# Patient Record
Sex: Male | Born: 1950 | Race: Black or African American | Hispanic: No | Marital: Married | Smoking: Never smoker
Health system: Southern US, Community
[De-identification: ages and names within clinical notes are randomized; demographics above are authoritative.]

## PROBLEM LIST (undated history)

## (undated) HISTORY — PX: COLOSTOMY: SHX63

---

## 2017-08-14 ENCOUNTER — Emergency Department (HOSPITAL_COMMUNITY)
Admission: EM | Admit: 2017-08-14 | Discharge: 2017-08-14 | Disposition: A | Discharge status: Home / Self Care | Payer: Medicare Other | Attending: Emergency Medicine | Admitting: Emergency Medicine

## 2017-08-14 ENCOUNTER — Encounter (HOSPITAL_COMMUNITY): Payer: Self-pay | Admitting: Emergency Medicine

## 2017-08-14 DIAGNOSIS — Z7189 Other specified counseling: Secondary | ICD-10-CM

## 2017-08-14 DIAGNOSIS — Z433 Encounter for attention to colostomy: Secondary | ICD-10-CM | POA: Insufficient documentation

## 2017-08-14 DIAGNOSIS — K922 Gastrointestinal hemorrhage, unspecified: Secondary | ICD-10-CM | POA: Diagnosis not present

## 2017-08-14 DIAGNOSIS — R195 Other fecal abnormalities: Secondary | ICD-10-CM | POA: Diagnosis not present

## 2017-08-14 DIAGNOSIS — Z4801 Encounter for change or removal of surgical wound dressing: Secondary | ICD-10-CM | POA: Insufficient documentation

## 2017-08-14 DIAGNOSIS — Z5189 Encounter for other specified aftercare: Secondary | ICD-10-CM

## 2017-08-14 LAB — COMPREHENSIVE METABOLIC PANEL
ALK PHOS: 63 U/L (ref 38–126)
ALT: 13 U/L — AB (ref 17–63)
ANION GAP: 5 (ref 5–15)
AST: 21 U/L (ref 15–41)
Albumin: 3 g/dL — ABNORMAL LOW (ref 3.5–5.0)
BILIRUBIN TOTAL: 0.3 mg/dL (ref 0.3–1.2)
BUN: 15 mg/dL (ref 6–20)
CALCIUM: 8.3 mg/dL — AB (ref 8.9–10.3)
CO2: 28 mmol/L (ref 22–32)
CREATININE: 0.78 mg/dL (ref 0.61–1.24)
Chloride: 107 mmol/L (ref 101–111)
GFR calc Af Amer: >60 mL/min (ref >60)
GFR calc non Af Amer: >60 mL/min (ref >60)
GLUCOSE: 96 mg/dL (ref 65–99)
Potassium: 3.8 mmol/L (ref 3.5–5.1)
SODIUM: 140 mmol/L (ref 135–145)
TOTAL PROTEIN: 7.1 g/dL (ref 6.5–8.1)

## 2017-08-14 LAB — TYPE AND SCREEN
ABO/RH(D): O POS
ANTIBODY SCREEN: NEGATIVE

## 2017-08-14 LAB — CBC
HCT: 31.1 % — ABNORMAL LOW (ref 39.0–52.0)
Hemoglobin: 11.3 g/dL — ABNORMAL LOW (ref 13.0–17.0)
MCH: 31.8 pg (ref 26.0–34.0)
MCHC: 36.3 g/dL — AB (ref 30.0–36.0)
MCV: 87.6 fL (ref 78.0–100.0)
PLATELETS: 268 10*3/uL (ref 150–400)
RBC: 3.55 MIL/uL — ABNORMAL LOW (ref 4.22–5.81)
RDW: 15.4 % (ref 11.5–15.5)
WBC: 7.6 10*3/uL (ref 4.0–10.5)

## 2017-08-14 LAB — ABO/RH: ABO/RH(D): O POS

## 2017-08-14 NOTE — ED Notes (Signed)
Pt stable, ambulatory, and verbalizes understanding of d/c instructions.  

## 2017-08-14 NOTE — ED Notes (Signed)
Occult card at bedside 

## 2017-08-14 NOTE — ED Triage Notes (Addendum)
Pt reports he had surgery on his rectum and a colostomy done 3-4 weeks ago, was supposed to have a follow up on 6/11 but could not go due to his mother being sick. reports that he is having pain and bleeding from his rectum that began a few days after surgery, reports bleeding is very mild, bright red in color, only a small amount when he wipes.

## 2017-08-14 NOTE — ED Provider Notes (Signed)
[facility] EM[facility]ENC[facility] Provider Note   CSN: [id number] Arrival date & time: 08/14/17  1009     History   Chief Complaint Chief Complaint  Patient presents with  . Post-op Problem    HPI Delena ServeCarl Monterrosa is a 67 y.o. male.  HPI  67 year old male comes in with chief complaint of bloody stools and ostomy problems. 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  Recently.  Patient has an ostomy bag in place.  He states that he was unable to get to his surgical follow-up on June 11, and states that he is out of his ostomy bags.  Patient also complains of persistent urge to defecate, with mucousy stools that are bloody.  No increase in stool output and no blood per ostomy bag.  Patient is not on any blood thinners and denies any dizziness, lightheadedness.  History reviewed. No pertinent past medical history.  There are no active problems to display for this patient.   Past Surgical History:  Procedure Laterality Date  . COLOSTOMY          Home Medications    Prior to Admission medications   Medication Sig Start Date End Date Taking? Authorizing Provider  acetaminophen (TYLENOL) 500 MG tablet Take 1,500 mg by mouth every 6 (six) hours as needed for mild pain or headache.   Yes [provider]  OVER THE COUNTER MEDICATION Apply 1 application topically daily as needed (Swelling of the Rectum).   Yes [provider]    Family History No family history on file.  Social History Social History   Tobacco Use  . Smoking status: Never Smoker  Substance Use Topics  . Alcohol use: Yes    Comment: occasional   . Drug use: Yes    Types: Marijuana     Allergies   Bee venom   Review of Systems Review of Systems  Constitutional: Positive for activity change.  Gastrointestinal: Positive for blood in stool.  Neurological: Negative for light-headedness.    Hematological: Does not bruise/bleed easily.     Physical Exam Updated Vital Signs BP (!) 154/77   Pulse 65   Temp 98.3 F (36.8 C) (Oral)   Resp 16   SpO2 100%   Physical Exam  Constitutional: He is oriented to person, place, and time. He appears well-developed.  HENT:  Head: Atraumatic.  Neck: Neck supple.  Cardiovascular: Normal rate.  Pulmonary/Chest: Effort normal.  Abdominal: Soft. There is no tenderness.  Patient has an ostomy wound which is covered by a Ziploc bag. No surrounding erythema or tenderness to palpation.  Neurological: He is alert and oriented to person, place, and time.  Skin: Skin is warm.  Nursing note and vitals reviewed.    ED Treatments / Results  Labs (all labs ordered are listed, but only abnormal results are displayed) Labs Reviewed  COMPREHENSIVE METABOLIC PANEL - Abnormal; Notable for the following components:      Result Value   Calcium 8.3 (*)    Albumin 3.0 (*)    ALT 13 (*)    All other components within normal limits  CBC - Abnormal; Notable for the following components:   RBC 3.55 (*)    Hemoglobin 11.3 (*)    HCT 31.1 (*)    MCHC 36.3 (*)    All other components within normal limits  TYPE AND SCREEN  ABO/RH    EKG None  Radiology No results found.  Procedures Procedures (including critical care time)  Medications Ordered in ED Medications - No data to display   Initial Impression / Assessment and Plan / ED Course  I have reviewed the triage vital signs and the nursing notes.  Pertinent labs & imaging results that were available during my care of the patient were reviewed by me and considered in my medical decision making (see chart for details).  Clinical Course as of Aug 14 1412  Sun Aug 14, 2017  1414 Results from the ER workup discussed with the patient face to face and all questions answered to the best of my ability.  Strict ER return precautions have been discussed, and patient is agreeing with the  plan and is comfortable with the workup done and the recommendations from the ER.    [AN]    Clinical Course User Index [AN] Derwood Kaplan, MD    67 year old male comes in with chief complaint of bloody stools and being out of ostomy supply. He has history of recent abdominal surgery that required partial colectomy.  Patient missed his June 11 appointment due to family emergency, and the surgery was done at the outside hospital.  I have called ostomy team to see if they can give patient the supplies. It seems like his bloody, mucousy stools are sequelae to his surgery.  Ostomy bag itself has no bloody drainage and there is no evidence of infection around the ostomy site.  Labs look reassuring. Patient informed that he will need to follow-up with his surgical service.  He will be discharged when the ostomy supplies been provided.  Final Clinical Impressions(s) / ED Diagnoses   Final diagnoses:  Encounter for ostomy care education  Visit for wound check  Acute lower GI bleeding    ED Discharge Orders    None       Derwood Kaplan, MD 08/14/17 1414

## 2017-08-14 NOTE — Discharge Instructions (Addendum)
You are seen in the ER for bloody stools and because you have run out of her ostomy bags.  We were able to give you supply of ostomy bag. There are no clinical concerns from our side with the mild bleeding or having, as in many instances it is expected. We [facility] for your postop follow-up and also to ensure that you are getting optimal ostomy care.

## 2017-09-11 ENCOUNTER — Encounter (HOSPITAL_COMMUNITY): Payer: Self-pay | Admitting: Emergency Medicine

## 2017-09-11 ENCOUNTER — Other Ambulatory Visit: Payer: Self-pay

## 2017-09-11 ENCOUNTER — Emergency Department (HOSPITAL_COMMUNITY)
Admission: EM | Admit: 2017-09-11 | Discharge: 2017-09-11 | Disposition: A | Discharge status: Home / Self Care | Payer: Medicare Other | Attending: Emergency Medicine | Admitting: Emergency Medicine

## 2017-09-11 DIAGNOSIS — Z933 Colostomy status: Secondary | ICD-10-CM | POA: Diagnosis not present

## 2017-09-11 DIAGNOSIS — Z760 Encounter for issue of repeat prescription: Secondary | ICD-10-CM | POA: Diagnosis present

## 2017-09-11 MED ORDER — LIDOCAINE HCL URETHRAL/MUCOSAL 2 % EX GEL: 1.0000 "application " | CUTANEOUS | 0 refills | Status: AC | PRN

## 2017-09-11 NOTE — ED Triage Notes (Signed)
Pt presents needing colostomy supplies; states he was sent the bags but needs the wafers that hold the bag; denies pain or other complications

## 2017-09-11 NOTE — ED Provider Notes (Signed)
[facility] Provider Note   CSN: [id number] Arrival date & time: 09/11/17  1746     History   Chief Complaint Chief Complaint  Patient presents with  . Colostomy supplies    HPI Brian Turner is a 67 y.o. male with a history of hidradenitis, enterocutaneous fistula, and partial colectomy who presents to the emergency department with a chief complaint of medication refill.  The patient has an ostomy bag in place.  He is scheduled to follow-up with his surgeon in 2 days, but has run out of his home colostomy supplies, specifically the wafers that hold the bag.   He also reports that he has noticed "a small ball" around the rectum and he is concerned that he is developing another abscess that may need to be drained.  He denies fever, chills, melena, hematochezia, increase in stool output, dizziness, or lightheadedness.  He reports that he is also out of his home pain medication and the lidocaine jelly that he was using for pain.   The history is provided by the patient. No language interpreter was used.    History reviewed. No pertinent past medical history.  There are no active problems to display for this patient.   Past Surgical History:  Procedure Laterality Date  . COLOSTOMY          Home Medications    Prior to Admission medications   Medication Sig Start Date End Date Taking? Authorizing Provider  acetaminophen (TYLENOL) 500 MG tablet Take 1,500 mg by mouth every 6 (six) hours as needed for mild pain or headache.    [provider]  lidocaine (XYLOCAINE) 2 % jelly 1 application by Other route as needed. Apply a thin layer to painful areas as needed. 09/11/17   Misael Mcgaha A, PA-C  OVER THE COUNTER MEDICATION Apply 1 application topically daily as needed (Swelling of the Rectum).    [provider]    Family History History reviewed. No pertinent family history.  Social History Social History   Tobacco Use    . Smoking status: Never Smoker  Substance Use Topics  . Alcohol use: Yes    Comment: occasional   . Drug use: Yes    Types: Marijuana     Allergies   Bee venom   Review of Systems Review of Systems  Constitutional: Negative for activity change, chills and fever.  Respiratory: Negative for shortness of breath.   Cardiovascular: Negative for chest pain.  Gastrointestinal: Negative for abdominal pain, anal bleeding and blood in stool.  Musculoskeletal: Negative for back pain.  Skin: Positive for wound. Negative for color change and rash.   Physical Exam Updated Vital Signs BP 133/76 (BP Location: Right Arm)   Pulse 63   Temp 98.7 F (37.1 C) (Oral)   Resp 16   Ht 5\' 9"  (1.753 m)   Wt 66.7 kg (147 lb)   SpO2 99%   BMI 21.71 kg/m   Physical Exam  Constitutional: He appears well-developed.  HENT:  Head: Normocephalic.  Eyes: Conjunctivae are normal.  Neck: Neck supple.  Cardiovascular: Normal rate and regular rhythm.  No murmur heard. Pulmonary/Chest: Effort normal.  Abdominal: Soft. He exhibits no distension and no mass. There is no tenderness. There is no rebound and no guarding. No hernia.  No tenderness to palpation to the ostomy site.  No surrounding erythema, edema, or warmth.  Genitourinary:  Genitourinary Comments: Chaperoned exam.  Well-healing surgical incision.  In the 3:00 area there appears  to be 0.5 cm area of fluctuance.  No active or drainage able to be expressed.  No surrounding erythema or warmth.  Neurological: He is alert.  Skin: Skin is warm and dry.  Psychiatric: His behavior is normal.  Nursing note and vitals reviewed.    ED Treatments / Results  Labs (all labs ordered are listed, but only abnormal results are displayed) Labs Reviewed - No data to display  EKG None  Radiology No results found.  Procedures Procedures (including critical care time)  Medications Ordered in ED Medications - No data to display   Initial  Impression / Assessment and Plan / ED Course  I have reviewed the triage vital signs and the nursing notes.  Pertinent labs & imaging results that were available during my care of the patient were reviewed by me and considered in my medical decision making (see chart for details).     67 year old male with a history of hidradenitis, enterocutaneous fistula, and partial colectomy who presents to the emergency department with a chief complaint of medication refill.  He is out of the way for as needed for his ostomy bag and does not have a follow-up appointment with his surgeon for another 2 days.  He also is concerned that may he may have another developing perianal abscess.  On exam, there is a 0.5 cm area of fluctuance in the 3:00 area.  Given the patient's extensive and complicated surgical history, will defer I&D at this time since the patient has a follow-up appointment in 2 days.  He has been given wafers in several ostomy pouches to last him for several days.  Strict return precautions given.  The patient is hemodynamically stable and in no acute distress.  He is safe for discharge home at this time with outpatient follow-up.  Final Clinical Impressions(s) / ED Diagnoses   Final diagnoses:  Encounter for medication refill    ED Discharge Orders        Ordered    lidocaine (XYLOCAINE) 2 % jelly  As needed     09/11/17 1924       Berthold Glace, Coral Else, PA-C 09/11/17 2133    Raeford Razor, MD 09/12/17 1531

## 2017-09-11 NOTE — Discharge Instructions (Signed)
Thank you for allowing me to care for you today in the Emergency Department.   Apply a thin layer of lidocaine jelly to areas that are sore as needed.  Keep your follow-up appointment with your surgeon on Tuesday to obtain your refills for your supplies.  Take 650 mg of Tylenol every 6 hours for pain control.   Return to the emergency department if you develop new or worsening symptoms including fever, chills, blood in your stool, or other new symptoms.

## 2017-09-11 NOTE — ED Notes (Signed)
Patient Alert and oriented to baseline. Stable and ambulatory to baseline. Patient verbalized understanding of the discharge instructions.  Patient belongings were taken by the patient.   

## 2017-11-13 ENCOUNTER — Emergency Department (HOSPITAL_COMMUNITY): Payer: Medicare Other

## 2017-11-13 ENCOUNTER — Other Ambulatory Visit: Payer: Self-pay

## 2017-11-13 ENCOUNTER — Emergency Department (HOSPITAL_COMMUNITY)
Admission: EM | Admit: 2017-11-13 | Discharge: 2017-11-14 | Disposition: A | Discharge status: Other Acute Inpatient Hospital | Payer: Medicare Other | Attending: Emergency Medicine | Admitting: Emergency Medicine

## 2017-11-13 DIAGNOSIS — Z933 Colostomy status: Secondary | ICD-10-CM | POA: Diagnosis not present

## 2017-11-13 DIAGNOSIS — K6289 Other specified diseases of anus and rectum: Secondary | ICD-10-CM | POA: Diagnosis present

## 2017-11-13 DIAGNOSIS — R509 Fever, unspecified: Secondary | ICD-10-CM | POA: Insufficient documentation

## 2017-11-13 DIAGNOSIS — K611 Rectal abscess: Secondary | ICD-10-CM | POA: Insufficient documentation

## 2017-11-13 LAB — CBC
HCT: 33 % — ABNORMAL LOW (ref 39.0–52.0)
HEMOGLOBIN: 11.9 g/dL — AB (ref 13.0–17.0)
MCH: 31.6 pg (ref 26.0–34.0)
MCHC: 36.1 g/dL — ABNORMAL HIGH (ref 30.0–36.0)
MCV: 87.5 fL (ref 78.0–100.0)
PLATELETS: 208 10*3/uL (ref 150–400)
RBC: 3.77 MIL/uL — AB (ref 4.22–5.81)
RDW: 15.2 % (ref 11.5–15.5)
WBC: 14.7 10*3/uL — ABNORMAL HIGH (ref 4.0–10.5)

## 2017-11-13 LAB — COMPREHENSIVE METABOLIC PANEL
ALBUMIN: 3 g/dL — AB (ref 3.5–5.0)
ALT: 12 U/L (ref 0–44)
AST: 22 U/L (ref 15–41)
Alkaline Phosphatase: 66 U/L (ref 38–126)
Anion gap: 10 (ref 5–15)
BUN: 11 mg/dL (ref 8–23)
CHLORIDE: 106 mmol/L (ref 98–111)
CO2: 23 mmol/L (ref 22–32)
CREATININE: 0.84 mg/dL (ref 0.61–1.24)
Calcium: 8.6 mg/dL — ABNORMAL LOW (ref 8.9–10.3)
GFR calc non Af Amer: >60 mL/min (ref >60)
GLUCOSE: 99 mg/dL (ref 70–99)
Potassium: 3.7 mmol/L (ref 3.5–5.1)
SODIUM: 139 mmol/L (ref 135–145)
Total Bilirubin: 0.4 mg/dL (ref 0.3–1.2)
Total Protein: 7.4 g/dL (ref 6.5–8.1)

## 2017-11-13 LAB — URINALYSIS, ROUTINE W REFLEX MICROSCOPIC
BACTERIA UA: NONE SEEN
Bilirubin Urine: NEGATIVE
Glucose, UA: NEGATIVE mg/dL
Hgb urine dipstick: NEGATIVE
Ketones, ur: 5 mg/dL — AB
Nitrite: NEGATIVE
Protein, ur: NEGATIVE mg/dL
SPECIFIC GRAVITY, URINE: 1.03 (ref 1.005–1.030)
pH: 5 (ref 5.0–8.0)

## 2017-11-13 MED ORDER — PIPERACILLIN-TAZOBACTAM 3.375 G IVPB 30 MIN
3.3750 g | Freq: Once | INTRAVENOUS | Status: AC
  Administered 2017-11-13: 3.375 g via INTRAVENOUS
  Filled 2017-11-13: qty 50

## 2017-11-13 MED ORDER — MORPHINE SULFATE (PF) 4 MG/ML IV SOLN
4.0000 mg | Freq: Once | INTRAVENOUS | Status: AC
  Administered 2017-11-13: 4 mg via INTRAVENOUS
  Filled 2017-11-13: qty 1

## 2017-11-13 MED ORDER — IOHEXOL 300 MG/ML  SOLN
100.0000 mL | Freq: Once | INTRAMUSCULAR | Status: AC | PRN
  Administered 2017-11-13: 100 mL via INTRAVENOUS

## 2017-11-13 NOTE — ED Provider Notes (Signed)
Emergency Department Provider Note   I have reviewed the triage vital signs and the nursing notes.   HISTORY  Chief Complaint Rectal Pain   HPI Brian Turner is a 67 y.o. 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 Elenore Rota at [facility] presents to the emergency department with rectal pain, fever yesterday, and some rectal drainage.  The patient states he is scheduled to follow with his GI surgeon later this month for additional procedures. He has an ostomy bag which is making good output. No fever today but reports a fever yesterday. Denies any anterior abdominal pain or vomiting. No radiation or symptoms or modifying factors. Patient called his surgeon on call at [facility] and states he is waiting on a call back.    No past medical history on file.  There are no active problems to display for this patient.   Past Surgical History:  Procedure Laterality Date  . COLOSTOMY      Allergies Bee venom  No family history on file.  Social History Social History   Tobacco Use  . Smoking status: Never Smoker  Substance Use Topics  . Alcohol use: Yes    Comment: occasional   . Drug use: Yes    Types: Marijuana    Review of Systems  Constitutional: No fever/chills Eyes: No visual changes. ENT: No sore throat. Cardiovascular: Denies chest pain. Respiratory: Denies shortness of breath. Gastrointestinal: No abdominal pain.  No nausea, no vomiting.  No diarrhea.  No constipation. Positive rectal pain.  Genitourinary: Negative for dysuria. Musculoskeletal: Negative for back pain. Skin: Negative for rash. Neurological: Negative for headaches, focal weakness or numbness.  10-point ROS otherwise negative.  ____________________________________________   PHYSICAL EXAM:  VITAL SIGNS: ED Triage Vitals  Enc Vitals Group     BP 11/13/17 1943 (!) 117/20     Pulse Rate 11/13/17 1943 70     Resp 11/13/17 1943 18     Temp  11/13/17 1943 99.3 F (37.4 C)     Temp src --      SpO2 11/13/17 1943 100 %     Weight 11/13/17 1947 147 lb (66.7 kg)     Height 11/13/17 1947 5\' 9"  (1.753 m)     Pain Score 11/13/17 1946 10   Constitutional: Alert and oriented. Well appearing and in no acute distress. Eyes: Conjunctivae are normal. Head: Atraumatic. Nose: No congestion/rhinnorhea. Mouth/Throat: Mucous membranes are moist.  Neck: No stridor.  Cardiovascular: Normal rate, regular rhythm. Good peripheral circulation. Grossly normal heart sounds.   Respiratory: Normal respiratory effort.  No retractions. Lungs CTAB. Gastrointestinal: Soft and nontender. No distention. Ostomy bag with good output. Seton's in place. No fluctuance or obvious abscess. No bleeding.  Musculoskeletal: No lower extremity tenderness nor edema. No gross deformities of extremities. Neurologic:  Normal speech and language. No gross focal neurologic deficits are appreciated.  Skin:  Skin is warm, dry and intact. No rash noted.  ____________________________________________   LABS (all labs ordered are listed, but only abnormal results are displayed)  Labs Reviewed  COMPREHENSIVE METABOLIC PANEL - Abnormal; Notable for the following components:      Result Value   Calcium 8.6 (*)    Albumin 3.0 (*)    All other components within normal limits  CBC - Abnormal; Notable for the following components:   WBC 14.7 (*)    RBC 3.77 (*)    Hemoglobin 11.9 (*)    HCT 33.0 (*)  MCHC 36.1 (*)    All other components within normal limits  URINALYSIS, ROUTINE W REFLEX MICROSCOPIC - Abnormal; Notable for the following components:   Color, Urine AMBER (*)    Ketones, ur 5 (*)    Leukocytes, UA SMALL (*)    All other components within normal limits   ____________________________________________  RADIOLOGY  Ct Abdomen Pelvis W Contrast  Result Date: 11/13/2017 CLINICAL DATA:  Rectal pain fever trace bleeding recent hemorrhoid procedure EXAM: CT  ABDOMEN AND PELVIS WITH CONTRAST TECHNIQUE: Multidetector CT imaging of the abdomen and pelvis was performed using the standard protocol following bolus administration of intravenous contrast. CONTRAST:  100mL OMNIPAQUE IOHEXOL 300 MG/ML  SOLN COMPARISON:  None. FINDINGS: Lower chest: Lung bases demonstrate no acute consolidation or effusion. The heart size is within normal limits. Hepatobiliary: No focal liver abnormality is seen. No gallstones, gallbladder wall thickening, or biliary dilatation. Pancreas: Unremarkable. No pancreatic ductal dilatation or surrounding inflammatory changes. Spleen: Normal in size without focal abnormality. Adrenals/Urinary Tract: Adrenal glands are unremarkable. Kidneys are normal, without renal calculi, focal lesion, or hydronephrosis. Bladder is unremarkable. Stomach/Bowel: The stomach is nonenlarged. No dilated small bowel. Left lower quadrant ostomy. Moderate perirectal soft tissue inflammatory change. Diffuse soft tissue thickening of the rectum and perianal soft tissues. Irregular mildly rim enhancing fluid collection within the left perirectal soft tissues measuring 3.9 x 1 cm concerning for abscess. Soft tissue thickening extending from the left posterior perianal region to the skin surface suspicious for fistula with small amount of rim enhancing fluid. Circular device within the right perianal soft tissues with surrounding soft tissue thickening. This appears to protrude external to the patient. Vascular/Lymphatic: Moderate-to-marked aortic atherosclerosis. No aneurysmal dilatation. Left external iliac stent with flow present. No significant adenopathy. Reproductive: Prostate calcification. Slightly indistinct plane between the posterior prostate and the anterior rectum. Other: Presacral and perirectal soft tissue edema. Prominent enhancing perirectal vessels without gross extravasation. Musculoskeletal: Degenerative changes. No acute or suspicious abnormality. IMPRESSION:  1. Marked rectal wall thickening with diffuse soft tissue enlargement of the perianal soft tissues consistent with proctitis. Irregular left perirectal fluid collection measuring 3.9 x 1 cm concerning for perirectal abscess. Circular surgical device within the right perianal soft tissues, partially external to the patient with surrounding soft tissue inflammatory changes. 2. Soft tissue thickening and small focal fluid collection extending from the left perianal region towards the medial gluteal surface, suspected to represent a fistula, possibly with small amount of infected fluid within. 3. Left lower quadrant ostomy. No evidence for bowel obstruction or acute intra-abdominal abnormality. Electronically Signed   By: Jasmine PangKim  Fujinaga M.D.   On: 11/13/2017 23:33    ____________________________________________   PROCEDURES  Procedure(s) performed:   Procedures  CRITICAL CARE Performed by: Maia PlanJoshua G Kristian Mogg Total critical care time: 35 minutes Critical care time was exclusive of separately billable procedures and treating other patients. Critical care was necessary to treat or prevent imminent or life-threatening deterioration. Critical care was time spent personally by me on the following activities: development of treatment plan with patient and/or surrogate as well as nursing, discussions with consultants, evaluation of patient's response to treatment, examination of patient, obtaining history from patient or surrogate, ordering and performing treatments and interventions, ordering and review of laboratory studies, ordering and review of radiographic studies, pulse oximetry and re-evaluation of patient's condition.  Alona BeneJoshua Baylen Buckner, MD Emergency Medicine  ____________________________________________   INITIAL IMPRESSION / ASSESSMENT AND PLAN / ED COURSE  Pertinent labs & imaging results that were available  during my care of the patient were reviewed by me and considered in my medical decision making  (see chart for details).  Patient presents to the emergency department with worsening rectal pain.  He has hidradenitis of the anus and is followed by Dr. Elenore Rota at [facility]. No obvious abscess on my exam but complicated by hydradenitis mass. Patient does have a leukocytosis on labs from triage. Plan for CT abdomen/pelvis to evaluate for deeper abscess not visualized on exam. If present, will discuss with surgery team at [facility].   11:56 PM Discussed the CT findings with radiology.  Had called Dr. Epifania Gore with GI surgery at [facility] to discuss the case. He accepts the patient in transfer to [facility]. Awaiting bed placement. EMTALA completed.  ____________________________________________  FINAL CLINICAL IMPRESSION(S) / ED DIAGNOSES  Final diagnoses:  Perirectal abscess    MEDICATIONS GIVEN DURING THIS VISIT:  Medications  piperacillin-tazobactam (ZOSYN) IVPB 3.375 g (3.375 g Intravenous New Bag/Given 11/13/17 2355)  morphine 4 MG/ML injection 4 mg (4 mg Intravenous Given 11/13/17 2203)  iohexol (OMNIPAQUE) 300 MG/ML solution 100 mL (100 mLs Intravenous Contrast Given 11/13/17 2242)    Note:  This document was prepared using Dragon voice recognition software and may include unintentional dictation errors.  Alona Bene, MD Emergency Medicine    [place] Zou, Arlyss Repress, MD 11/13/17 [phone]

## 2017-11-13 NOTE — ED Triage Notes (Addendum)
Patient c/o rectal pain (states [facility] ago to lance his "sweat glands") and states that his urine now looks "dirty" and "real dark". Also c/o testicle pain.

## 2017-11-13 NOTE — ED Notes (Signed)
[facility], [place] called to ask for a disc to send with pt

## 2017-11-14 DIAGNOSIS — R509 Fever, unspecified: Secondary | ICD-10-CM | POA: Diagnosis not present

## 2017-11-14 DIAGNOSIS — Z933 Colostomy status: Secondary | ICD-10-CM | POA: Diagnosis not present

## 2017-11-14 DIAGNOSIS — K6289 Other specified diseases of anus and rectum: Secondary | ICD-10-CM | POA: Diagnosis present

## 2017-11-14 DIAGNOSIS — K611 Rectal abscess: Secondary | ICD-10-CM | POA: Diagnosis not present

## 2017-11-14 NOTE — ED Notes (Signed)
[facility] called [facility]

## 2017-11-18 ENCOUNTER — Other Ambulatory Visit: Payer: Self-pay

## 2017-11-18 ENCOUNTER — Encounter (HOSPITAL_COMMUNITY): Payer: Self-pay | Admitting: Emergency Medicine

## 2017-11-18 ENCOUNTER — Emergency Department (HOSPITAL_COMMUNITY)
Admission: EM | Admit: 2017-11-18 | Discharge: 2017-11-19 | Disposition: A | Discharge status: Home / Self Care | Payer: Medicare Other | Attending: Emergency Medicine | Admitting: Emergency Medicine

## 2017-11-18 DIAGNOSIS — M79602 Pain in left arm: Secondary | ICD-10-CM | POA: Diagnosis present

## 2017-11-18 DIAGNOSIS — M7701 Medial epicondylitis, right elbow: Secondary | ICD-10-CM | POA: Insufficient documentation

## 2017-11-18 DIAGNOSIS — M7702 Medial epicondylitis, left elbow: Secondary | ICD-10-CM

## 2017-11-18 NOTE — ED Triage Notes (Signed)
Pt to ED with c/o bil arm swelling.  Pt st's he was [facility] after surg.  Pt st's now he has bil arm swelling and was told to come to ED

## 2017-11-19 MED ORDER — AMOXICILLIN-POT CLAVULANATE 875-125 MG PO TABS: 1.0000 | ORAL_TABLET | Freq: Two times a day (BID) | ORAL | 0 refills | Status: AC

## 2017-11-19 NOTE — ED Provider Notes (Signed)
[facility] EMERGENCY [facility]MENT Provider Note   CSN: [id number] Arrival date & time: 11/18/17  1720     History   Chief Complaint Chief Complaint  Patient presents with  . Arm Swelling    HPI Brian Turner is a 67 y.o. male.  Patient presents to the emergency department with a chief complaint of bilateral arm pain.  He states that he noticed the pain a couple of days ago.  States that he has been taking Cipro for recent abdominal infection.  He denies any fevers or chills.  He denies any other associated symptoms.  Denies any injuries.  The history is provided by the patient. No language interpreter was used.    History reviewed. No pertinent past medical history.  There are no active problems to display for this patient.   Past Surgical History:  Procedure Laterality Date  . COLOSTOMY          Home Medications    Prior to Admission medications   Medication Sig Start Date End Date Taking? Authorizing Provider  acetaminophen (TYLENOL) 500 MG tablet Take 1,500 mg by mouth every 6 (six) hours as needed for mild pain or headache.   Yes [provider]  Oxycodone HCl 10 MG TABS Take 10 mg by mouth every 4 (four) hours as needed (for pain).   Yes [provider]  amoxicillin-clavulanate (AUGMENTIN) 875-125 MG tablet Take 1 tablet by mouth every 12 (twelve) hours. 11/19/17   Roxy HorsemanBrowning, Kaydie Petsch, PA-C  lidocaine (XYLOCAINE) 2 % jelly 1 application by Other route as needed. Apply a thin layer to painful areas as needed. Patient not taking: Reported on 11/19/2017 09/11/17   Barkley BoardsMcDonald, Mia A, PA-C    Family History No family history on file.  Social History Social History   Tobacco Use  . Smoking status: Never Smoker  . Smokeless tobacco: Never Used  Substance Use Topics  . Alcohol use: Yes    Comment: occasional   . Drug use: Yes    Types: Marijuana     Allergies   Bee venom   Review of Systems Review of Systems  All other systems  reviewed and are negative.    Physical Exam Updated Vital Signs BP 132/69   Pulse 67   Temp 98.7 F (37.1 C) (Oral)   Resp 16   Ht 5\' 9"  (1.753 m)   Wt 65.3 kg   SpO2 99%   BMI 21.27 kg/m   Physical Exam  Constitutional: He is oriented to person, place, and time. He appears well-developed and well-nourished.  HENT:  Head: Normocephalic and atraumatic.  Eyes: Pupils are equal, round, and reactive to light. Conjunctivae and EOM are normal. Right eye exhibits no discharge. Left eye exhibits no discharge. No scleral icterus.  Neck: Normal range of motion. Neck supple. No JVD present.  Cardiovascular: Normal rate, regular rhythm and normal heart sounds. Exam reveals no gallop and no friction rub.  No murmur heard. Pulmonary/Chest: Effort normal and breath sounds normal. No respiratory distress. He has no wheezes. He has no rales. He exhibits no tenderness.  Abdominal: Soft. He exhibits no distension and no mass. There is no tenderness. There is no rebound and no guarding.  Musculoskeletal: Normal range of motion. He exhibits no edema or tenderness.  Bilateral tenderness over his medial epicondyles, no erythema or evidence of abscess  Neurological: He is alert and oriented to person, place, and time.  Skin: Skin is warm and dry.  Psychiatric: He has a normal  mood and affect. His behavior is normal. Judgment and thought content normal.  Nursing note and vitals reviewed.    ED Treatments / Results  Labs (all labs ordered are listed, but only abnormal results are displayed) Labs Reviewed - No data to display  EKG None  Radiology No results found.  Procedures Procedures (including critical care time)  Medications Ordered in ED Medications - No data to display   Initial Impression / Assessment and Plan / ED Course  I have reviewed the triage vital signs and the nursing notes.  Pertinent labs & imaging results that were available during my care of the patient were  reviewed by me and considered in my medical decision making (see chart for details).    Patient with symptoms concerning for bilateral medial epicondylitis.  There is some concern that this is related to his Cipro use, as this started after taking the Cipro.  We will discontinue this and start Augmentin.  I have also discontinued his Flagyl.  Recommend ice and heat.  Recommend caution with elbow use.  PCP follow-up.  Patient seen by and discussed with Dr. Blinda Leatherwood, who agrees with the plan.  Final Clinical Impressions(s) / ED Diagnoses   Final diagnoses:  Medial epicondylitis of left elbow  Epicondylitis elbow, medial, right    ED Discharge Orders         Ordered    amoxicillin-clavulanate (AUGMENTIN) 875-125 MG tablet  Every 12 hours     11/19/17 0634           Roxy Horseman, PA-C 11/19/17 4098    Gilda Crease, MD 11/19/17 [phone]

## 2017-11-19 NOTE — Discharge Instructions (Signed)
Your symptoms are thought to have been caused by the antibiotic ciprofloxacin.  Please stop taking the antibiotics prescribed (Cipro and metronidazole), we have switched your antibiotic to Augmentin.  Please take this instead.  Please follow-up with your regular doctor.  You may apply ice to your elbows.

## 2018-01-25 ENCOUNTER — Encounter (HOSPITAL_COMMUNITY): Payer: Self-pay

## 2018-01-25 ENCOUNTER — Emergency Department (HOSPITAL_COMMUNITY)
Admission: EM | Admit: 2018-01-25 | Discharge: 2018-01-26 | Payer: Medicare Other | Attending: Emergency Medicine | Admitting: Emergency Medicine

## 2018-01-25 DIAGNOSIS — Z933 Colostomy status: Secondary | ICD-10-CM | POA: Insufficient documentation

## 2018-01-25 DIAGNOSIS — Z79899 Other long term (current) drug therapy: Secondary | ICD-10-CM | POA: Diagnosis not present

## 2018-01-25 DIAGNOSIS — L732 Hidradenitis suppurativa: Secondary | ICD-10-CM | POA: Insufficient documentation

## 2018-01-25 DIAGNOSIS — K610 Anal abscess: Secondary | ICD-10-CM | POA: Diagnosis not present

## 2018-01-25 DIAGNOSIS — K625 Hemorrhage of anus and rectum: Secondary | ICD-10-CM | POA: Diagnosis present

## 2018-01-25 LAB — COMPREHENSIVE METABOLIC PANEL
ALT: 14 U/L (ref 0–44)
AST: 45 U/L — ABNORMAL HIGH (ref 15–41)
Albumin: 3.2 g/dL — ABNORMAL LOW (ref 3.5–5.0)
Alkaline Phosphatase: 67 U/L (ref 38–126)
Anion gap: 9 (ref 5–15)
BUN: 11 mg/dL (ref 8–23)
CHLORIDE: 99 mmol/L (ref 98–111)
CO2: 28 mmol/L (ref 22–32)
CREATININE: 0.86 mg/dL (ref 0.61–1.24)
Calcium: 8.8 mg/dL — ABNORMAL LOW (ref 8.9–10.3)
GFR calc non Af Amer: >60 mL/min (ref >60)
GLUCOSE: 90 mg/dL (ref 70–99)
Potassium: 3.8 mmol/L (ref 3.5–5.1)
Sodium: 136 mmol/L (ref 135–145)
Total Bilirubin: 0.7 mg/dL (ref 0.3–1.2)
Total Protein: 8.6 g/dL — ABNORMAL HIGH (ref 6.5–8.1)

## 2018-01-25 LAB — CBC
HCT: 33.3 % — ABNORMAL LOW (ref 39.0–52.0)
Hemoglobin: 11.9 g/dL — ABNORMAL LOW (ref 13.0–17.0)
MCH: 30.4 pg (ref 26.0–34.0)
MCHC: 35.7 g/dL (ref 30.0–36.0)
MCV: 84.9 fL (ref 80.0–100.0)
NRBC: 0 % (ref 0.0–0.2)
PLATELETS: 332 10*3/uL (ref 150–400)
RBC: 3.92 MIL/uL — AB (ref 4.22–5.81)
RDW: 14.4 % (ref 11.5–15.5)
WBC: 13.5 10*3/uL — ABNORMAL HIGH (ref 4.0–10.5)

## 2018-01-25 LAB — TYPE AND SCREEN
ABO/RH(D): O POS
Antibody Screen: NEGATIVE

## 2018-01-25 NOTE — ED Triage Notes (Signed)
Pt states for the past two days that he has been having rectal bleeding, [facility]. Pt also states that he needs colostomy supplies that he is out of wafers.

## 2018-01-26 ENCOUNTER — Emergency Department (HOSPITAL_COMMUNITY): Payer: Medicare Other

## 2018-01-26 DIAGNOSIS — K625 Hemorrhage of anus and rectum: Secondary | ICD-10-CM | POA: Diagnosis present

## 2018-01-26 DIAGNOSIS — K610 Anal abscess: Secondary | ICD-10-CM | POA: Diagnosis not present

## 2018-01-26 DIAGNOSIS — Z933 Colostomy status: Secondary | ICD-10-CM | POA: Diagnosis not present

## 2018-01-26 DIAGNOSIS — Z79899 Other long term (current) drug therapy: Secondary | ICD-10-CM | POA: Diagnosis not present

## 2018-01-26 DIAGNOSIS — L732 Hidradenitis suppurativa: Secondary | ICD-10-CM | POA: Diagnosis not present

## 2018-01-26 LAB — I-STAT CG4 LACTIC ACID, ED
Lactic Acid, Venous: 0.65 mmol/L (ref 0.5–1.9)
Lactic Acid, Venous: 0.81 mmol/L (ref 0.5–1.9)

## 2018-01-26 MED ORDER — IOHEXOL 300 MG/ML  SOLN
100.0000 mL | Freq: Once | INTRAMUSCULAR | Status: AC | PRN
  Administered 2018-01-26: 100 mL via INTRAVENOUS

## 2018-01-26 NOTE — ED Notes (Signed)
The pt reports that he gets his medical care at [facility]

## 2018-01-26 NOTE — ED Notes (Signed)
Paged [facility] general surgery

## 2018-01-26 NOTE — ED Notes (Signed)
To ct

## 2018-01-26 NOTE — ED Notes (Signed)
The pt has a colostomy he reports that he does nt have the supplies he needs for the colostomy bag

## 2018-01-26 NOTE — ED Provider Notes (Signed)
[facility] EMERGENCY DEPARTMENT Provider Note   CSN: [id number] Arrival date & time: 01/25/18  2155     History   Chief Complaint Chief Complaint  Patient presents with  . Rectal Bleeding    HPI Brian Turner is a 67 y.o. male.  Patient presents to the emergency department with a chief complaint of rectal discharge.  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  He receives his care at [facility].  He reports having had the discharge for the past 2 days.  He denies any fevers or chills.  He reports increased pain.  Denies having taken anything for symptoms.  The history is provided by the patient. No language interpreter was used.    History reviewed. No pertinent past medical history.  There are no active problems to display for this patient.   Past Surgical History:  Procedure Laterality Date  . COLOSTOMY          Home Medications    Prior to Admission medications   Medication Sig Start Date End Date Taking? Authorizing Provider  acetaminophen (TYLENOL) 500 MG tablet Take 1,500 mg by mouth every 6 (six) hours as needed for mild pain or headache.    [provider]  amoxicillin-clavulanate (AUGMENTIN) 875-125 MG tablet Take 1 tablet by mouth every 12 (twelve) hours. 11/19/17   Roxy Horseman, PA-C  lidocaine (XYLOCAINE) 2 % jelly 1 application by Other route as needed. Apply a thin layer to painful areas as needed. Patient not taking: Reported on 11/19/2017 09/11/17   McDonald, Pedro Earls A, PA-C  Oxycodone HCl 10 MG TABS Take 10 mg by mouth every 4 (four) hours as needed (for pain).    [provider]    Family History No family history on file.  Social History Social History   Tobacco Use  . Smoking status: Never Smoker  . Smokeless tobacco: Never Used  Substance Use Topics  . Alcohol use: Yes    Comment: occasional   . Drug use: Yes    Types: Marijuana     Allergies   Bee venom   Review of  Systems Review of Systems  All other systems reviewed and are negative.    Physical Exam Updated Vital Signs BP 131/70 (BP Location: Right Arm)   Pulse 76   Temp 99.3 F (37.4 C) (Oral)   Resp 18   Ht 5\' 9"  (1.753 m)   Wt 66.7 kg   SpO2 100%   BMI 21.71 kg/m   Physical Exam  Constitutional: He is oriented to person, place, and time. He appears well-developed and well-nourished.  HENT:  Head: Normocephalic and atraumatic.  Eyes: Pupils are equal, round, and reactive to light. Conjunctivae and EOM are normal. Right eye exhibits no discharge. Left eye exhibits no discharge. No scleral icterus.  Neck: Normal range of motion. Neck supple. No JVD present.  Cardiovascular: Normal rate, regular rhythm and normal heart sounds. Exam reveals no gallop and no friction rub.  No murmur heard. Pulmonary/Chest: Effort normal and breath sounds normal. No respiratory distress. He has no wheezes. He has no rales. He exhibits no tenderness.  Abdominal: Soft. He exhibits no distension and no mass. There is no tenderness. There is no rebound and no guarding.  Ostomy in lower central abdomen, functional, but with contact dermatitis around the base due to lack of dressing changes  Genitourinary:  Genitourinary Comments: Very large external hemorrhoid with purulent discharge concerning for redevelopment of perirectal abscess  Musculoskeletal: Normal range of motion. He exhibits no edema or tenderness.  Neurological: He is alert and oriented to person, place, and time.  Skin: Skin is warm and dry.  Psychiatric: He has a normal mood and affect. His behavior is normal. Judgment and thought content normal.  Nursing note and vitals reviewed.    ED Treatments / Results  Labs (all labs ordered are listed, but only abnormal results are displayed) Labs Reviewed  COMPREHENSIVE METABOLIC PANEL - Abnormal; Notable for the following components:      Result Value   Calcium 8.8 (*)    Total Protein 8.6 (*)     Albumin 3.2 (*)    AST 45 (*)    All other components within normal limits  CBC - Abnormal; Notable for the following components:   WBC 13.5 (*)    RBC 3.92 (*)    Hemoglobin 11.9 (*)    HCT 33.3 (*)    All other components within normal limits  I-STAT CG4 LACTIC ACID, ED  TYPE AND SCREEN    EKG None  Radiology Ct Abdomen Pelvis W Contrast  Result Date: 01/26/2018 CLINICAL DATA:  Rectal bleeding for several days.  Perianal abscess. EXAM: CT ABDOMEN AND PELVIS WITH CONTRAST TECHNIQUE: Multidetector CT imaging of the abdomen and pelvis was performed using the standard protocol following bolus administration of intravenous contrast. CONTRAST:  100mL OMNIPAQUE IOHEXOL 300 MG/ML  SOLN COMPARISON:  11/13/2017 FINDINGS: Lower Chest: No acute findings. Hepatobiliary: No hepatic masses identified. Gallbladder is unremarkable. Pancreas:  No mass or inflammatory changes. Spleen: Within normal limits in size and appearance. Adrenals/Urinary Tract: No masses identified. No evidence of hydronephrosis. Stomach/Bowel: Left lower quadrant colostomy is again seen. Diffuse rectal wall thickening and enhancement is unchanged since previous study and consistent with proctitis. A more well-defined horseshoe peripherally enhancing fluid collection is seen surrounding the posterior aspect of the anal canal, consistent with perianal abscess. This measures approximately 3.9 x 2.4 cm compared to 3.7 x 1.6 cm on prior study. A seton is seen along the posterior and inferior aspect of this collection, which extends into the gluteal crease. Vascular/Lymphatic: No pathologically enlarged lymph nodes. No abdominal aortic aneurysm. Aortic atherosclerosis. Reproductive:  No mass or other significant abnormality. Other:  None. Musculoskeletal:  No suspicious bone lesions identified. 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 Electronically Signed   By: Myles RosenthalJohn  Stahl M.D.    On: 01/26/2018 02:31    Procedures Procedures (including critical care time)  Medications Ordered in ED Medications - No data to display   Initial Impression / Assessment and Plan / ED Course  I have reviewed the triage vital signs and the nursing notes.  Pertinent labs & imaging results that were available during my care of the patient were reviewed by me and considered in my medical decision making (see chart for details).     Patient with a large perianal abscess.  Reports worsening discharge and drainage today.  [facility].  States that normally when this happens [facility]  CT shows increased size of a horseshoe perianal abscess when compared to prior CT done back in September.  Patient remains afebrile, but does have a leukocytosis to 13.5.  He does not have an elevated lactic acid level.  He states that it is unusual for her to drain this much.  Discussed this case [facility] Dr. Elwin Mochahaumont, who recommends ED to ED transfer  for general surgery evaluation and possible admission.  I discussed with the patient that it is possible that he may not be admitted.  He lives at home with his 11 year old mother, and unfortunately transportation upon discharge will be problematic.  Case discussed with Dr. Norlene Campbell, from [facility] emergency, who will accept the patient in transfer.  Patient seen by and discussed with Dr. Rhunette Croft.  Final Clinical Impressions(s) / ED Diagnoses   Final diagnoses:  Perianal abscess    ED Discharge Orders    None       Roxy Horseman, PA-C 01/26/18 0423    Derwood Kaplan, MD 01/26/18 [phone]

## 2018-01-26 NOTE — ED Notes (Signed)
Pt stands for [facility]. Some light-headed feelings, but steady on feet with minimal assistance x1 person.

## 2018-12-31 DEATH — deceased

## 2020-01-08 IMAGING — CT CT ABD-PELV W/ CM
2 of 5 series · 16 of 46 positions shown, 18 images · IV contrast (Omni 300)
Comparison: 11/13/2017

CLINICAL DATA: Rectal bleeding for several days.  Perianal abscess.

EXAM:
CT ABDOMEN AND PELVIS WITH CONTRAST
TECHNIQUE: Multidetector CT imaging of the abdomen and pelvis was performed
using the standard protocol following bolus administration of
intravenous contrast.
CONTRAST:  100mL OMNIPAQUE IOHEXOL 300 MG/ML  SOLN

[Series 3: a/p w/ 5mm · axial · 0.78mm/px · z∈[+1033,+1418]mm · 13 of 87 slices shown, 15 images]
[im 5/87  soft-tissue]
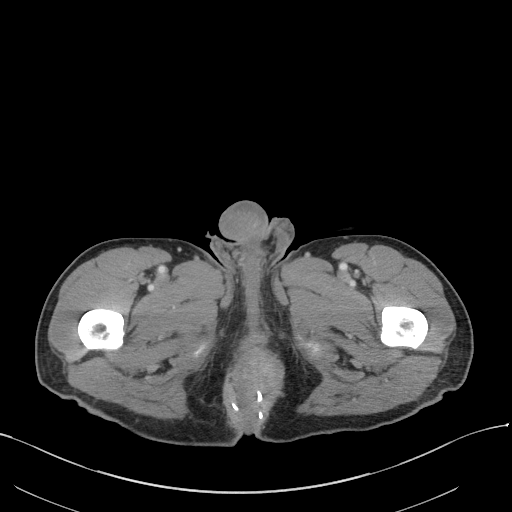
[im 5/87  bone]
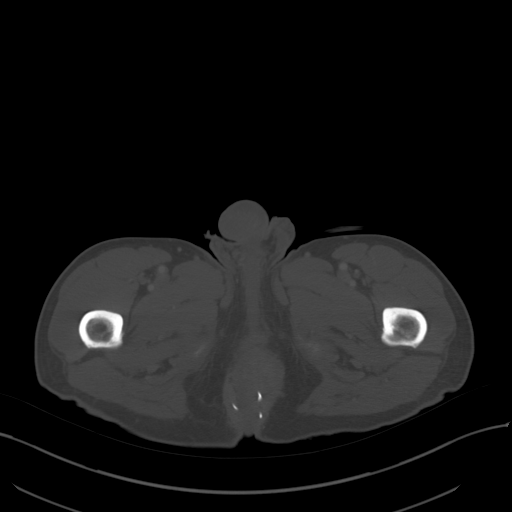
[im 14/87  soft-tissue]
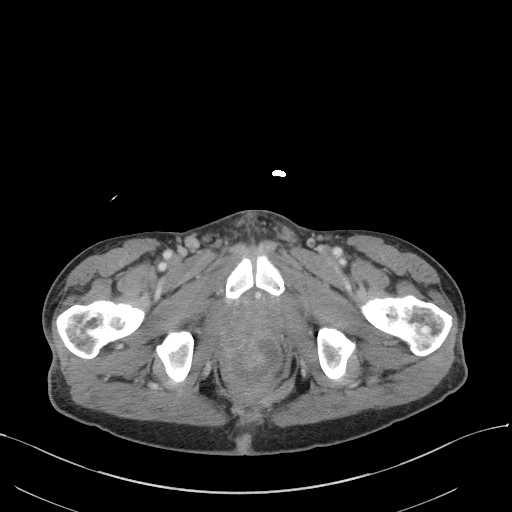
[im 19/87  soft-tissue]
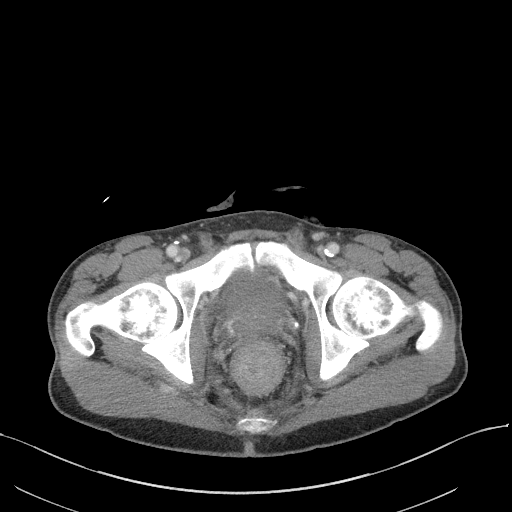
[im 23/87  soft-tissue]
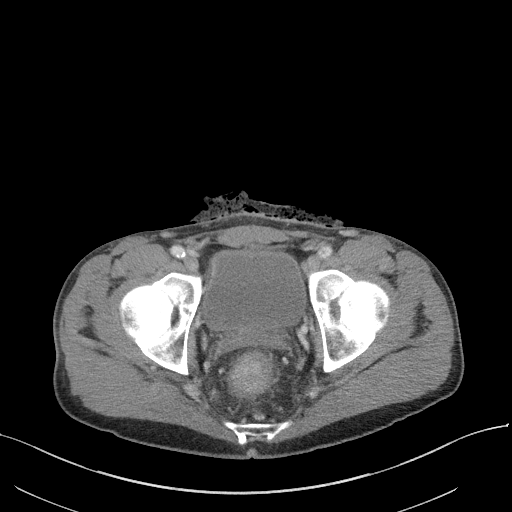
[im 32/87  soft-tissue]
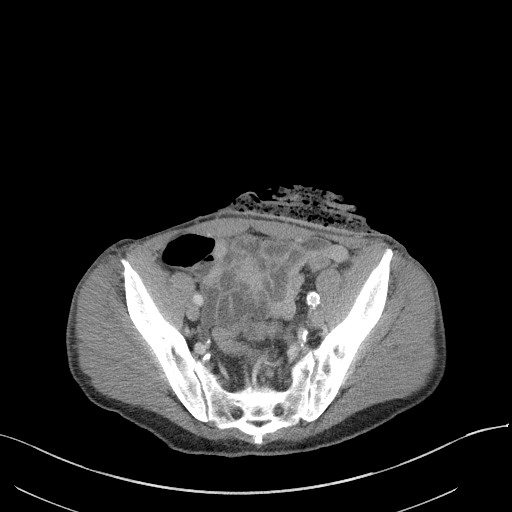
[im 37/87  soft-tissue]
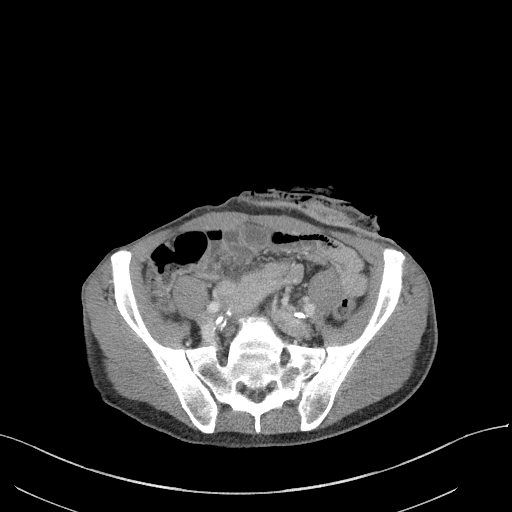
[im 46/87  soft-tissue]
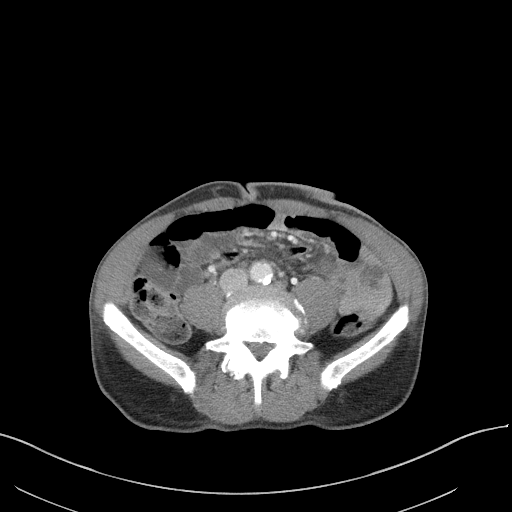
[im 50/87  soft-tissue]
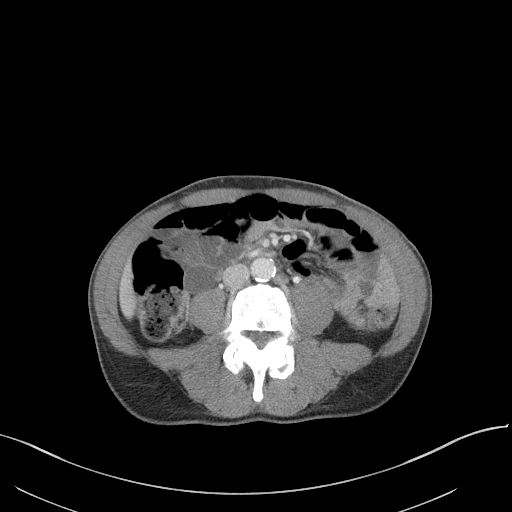
[im 55/87  soft-tissue]
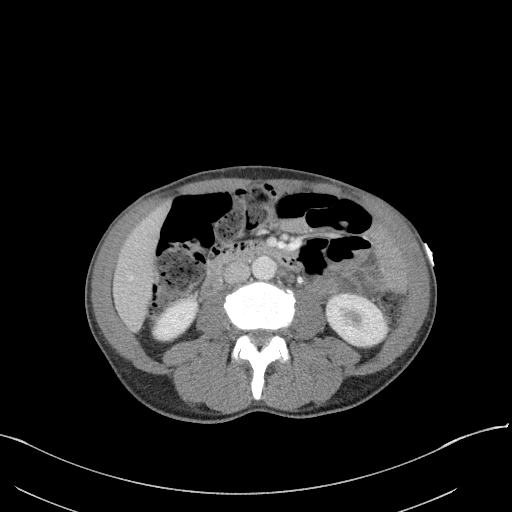
[im 55/87  bone]
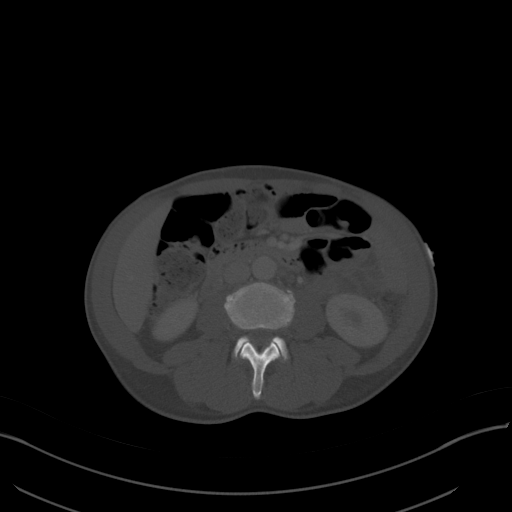
[im 64/87  soft-tissue]
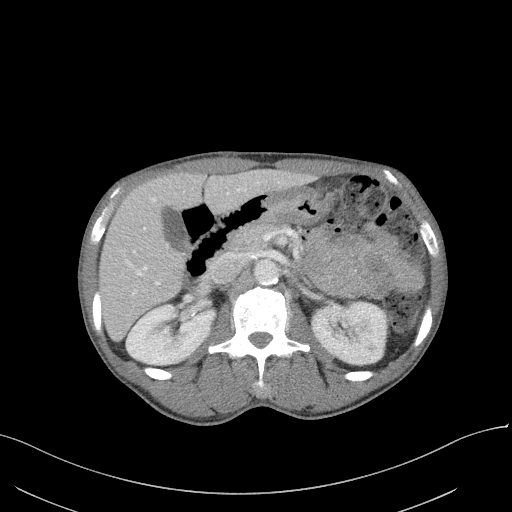
[im 68/87  soft-tissue]
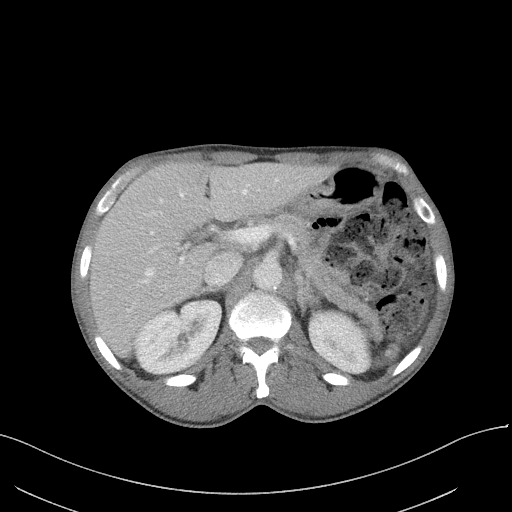
[im 73/87  soft-tissue]
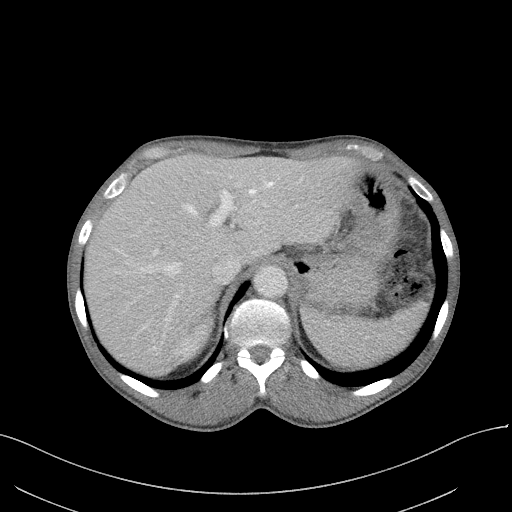
[im 82/87  soft-tissue]
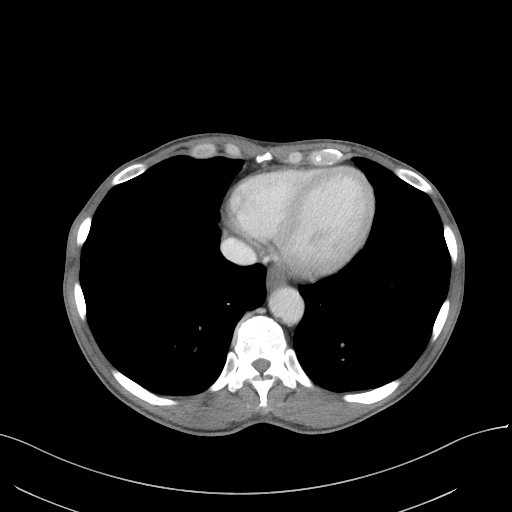

[Series 6: a/p w/ cor · coronal · 0.74mm/px · 3 of 120 slices shown]
[im 40/120  soft-tissue]
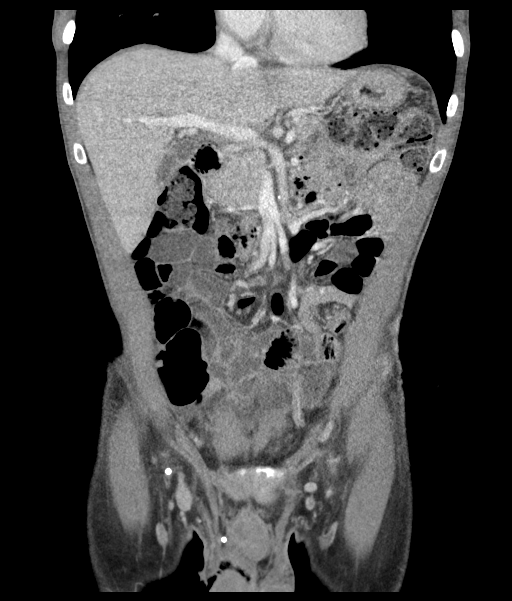
[im 53/120  soft-tissue]
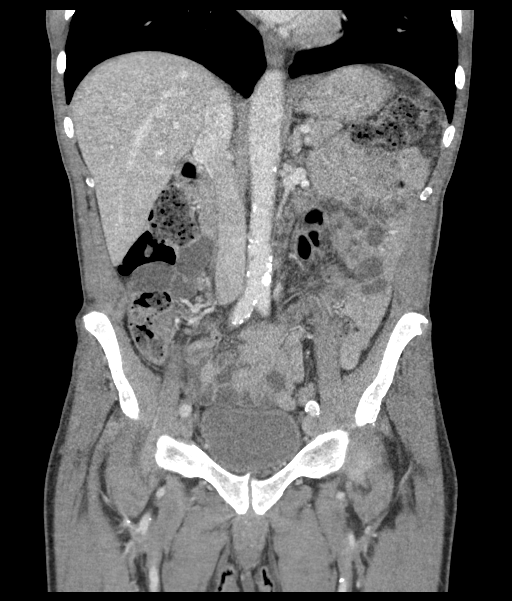
[im 67/120  soft-tissue]
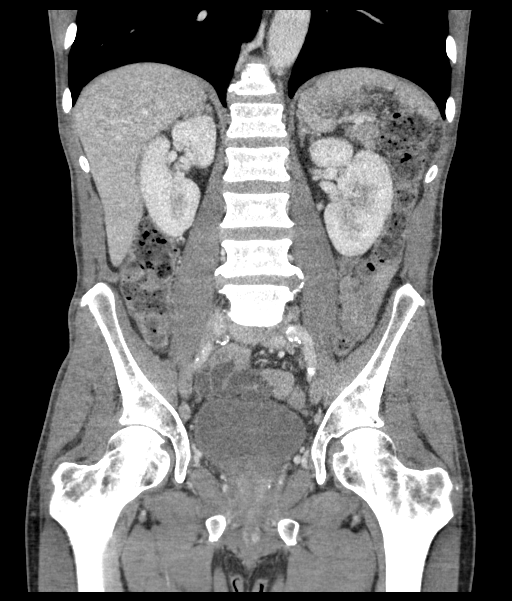

[16 of 46 positions shown; findings below may reference images not displayed]

FINDINGS: Lower Chest: No acute findings.

Hepatobiliary: No hepatic masses identified. Gallbladder is
unremarkable.

Pancreas:  No mass or inflammatory changes.

Spleen: Within normal limits in size and appearance.

Adrenals/Urinary Tract: No masses identified. No evidence of
hydronephrosis.

Stomach/Bowel: Left lower quadrant colostomy is again seen. Diffuse
rectal wall thickening and enhancement is unchanged since previous
study and consistent with proctitis.

A more well-defined horseshoe peripherally enhancing fluid
collection is seen surrounding the posterior aspect of the anal
canal, consistent with perianal abscess. This measures approximately
3.9 x 2.4 cm compared to 3.7 x 1.6 cm on prior study. A seton is
seen along the posterior and inferior aspect of this collection,
which extends into the gluteal crease.

Vascular/Lymphatic: No pathologically enlarged lymph nodes. No
abdominal aortic aneurysm. Aortic atherosclerosis.

Reproductive:  No mass or other significant abnormality.

Other:  None.

Musculoskeletal:  No suspicious bone lesions identified.
IMPRESSION: Persistent proctitis. Mild increase in size of horseshoe-shaped
posterior perianal abscess.
